# Patient Record
Sex: Female | Born: 1955 | Race: White | Hispanic: No | Marital: Married | Smoking: Never smoker
Health system: Southern US, Community
[De-identification: ages and names within clinical notes are randomized; demographics above are authoritative.]

## PROBLEM LIST (undated history)

## (undated) DIAGNOSIS — M810 Age-related osteoporosis without current pathological fracture: Secondary | ICD-10-CM

## (undated) DIAGNOSIS — L309 Dermatitis, unspecified: Secondary | ICD-10-CM

## (undated) HISTORY — PX: ELBOW SURGERY: SHX618

## (undated) HISTORY — DX: Age-related osteoporosis without current pathological fracture: M81.0

## (undated) HISTORY — DX: Dermatitis, unspecified: L30.9

## (undated) HISTORY — PX: KNEE SURGERY: SHX244

---

## 1998-09-20 ENCOUNTER — Other Ambulatory Visit: Admission: RE | Admit: 1998-09-20 | Discharge: 1998-09-20 | Payer: Self-pay | Admitting: Obstetrics & Gynecology

## 1999-09-23 ENCOUNTER — Other Ambulatory Visit: Admission: RE | Admit: 1999-09-23 | Discharge: 1999-09-23 | Payer: Self-pay | Admitting: Obstetrics & Gynecology

## 2000-09-29 ENCOUNTER — Other Ambulatory Visit: Admission: RE | Admit: 2000-09-29 | Discharge: 2000-09-29 | Payer: Self-pay | Admitting: Obstetrics & Gynecology

## 2001-01-18 ENCOUNTER — Other Ambulatory Visit: Admission: RE | Admit: 2001-01-18 | Discharge: 2001-01-18 | Payer: Self-pay | Admitting: Obstetrics & Gynecology

## 2001-10-25 ENCOUNTER — Other Ambulatory Visit: Admission: RE | Admit: 2001-10-25 | Discharge: 2001-10-25 | Payer: Self-pay | Admitting: Obstetrics & Gynecology

## 2002-04-13 ENCOUNTER — Other Ambulatory Visit: Admission: RE | Admit: 2002-04-13 | Discharge: 2002-04-13 | Payer: Self-pay | Admitting: Obstetrics & Gynecology

## 2002-10-04 ENCOUNTER — Encounter: Payer: Self-pay | Admitting: Unknown Physician Specialty

## 2002-10-04 ENCOUNTER — Encounter: Admission: RE | Admit: 2002-10-04 | Discharge: 2002-10-04 | Payer: Self-pay | Admitting: Unknown Physician Specialty

## 2002-12-12 ENCOUNTER — Other Ambulatory Visit: Admission: RE | Admit: 2002-12-12 | Discharge: 2002-12-12 | Payer: Self-pay | Admitting: Obstetrics & Gynecology

## 2003-12-14 ENCOUNTER — Other Ambulatory Visit: Admission: RE | Admit: 2003-12-14 | Discharge: 2003-12-14 | Payer: Self-pay | Admitting: Obstetrics & Gynecology

## 2005-01-03 ENCOUNTER — Other Ambulatory Visit: Admission: RE | Admit: 2005-01-03 | Discharge: 2005-01-03 | Payer: Self-pay | Admitting: Obstetrics & Gynecology

## 2005-06-23 ENCOUNTER — Other Ambulatory Visit: Admission: RE | Admit: 2005-06-23 | Discharge: 2005-06-23 | Payer: Self-pay | Admitting: Obstetrics & Gynecology

## 2006-02-02 ENCOUNTER — Other Ambulatory Visit: Admission: RE | Admit: 2006-02-02 | Discharge: 2006-02-02 | Payer: Self-pay | Admitting: Obstetrics & Gynecology

## 2010-04-24 ENCOUNTER — Encounter: Admission: RE | Admit: 2010-04-24 | Discharge: 2010-04-24 | Payer: Self-pay | Admitting: Obstetrics & Gynecology

## 2011-01-19 ENCOUNTER — Encounter: Payer: Self-pay | Admitting: Obstetrics & Gynecology

## 2017-09-30 ENCOUNTER — Other Ambulatory Visit: Payer: Self-pay | Admitting: Obstetrics & Gynecology

## 2017-09-30 DIAGNOSIS — E2839 Other primary ovarian failure: Secondary | ICD-10-CM

## 2017-10-02 ENCOUNTER — Other Ambulatory Visit: Payer: Self-pay | Admitting: Obstetrics & Gynecology

## 2017-10-02 DIAGNOSIS — E2839 Other primary ovarian failure: Secondary | ICD-10-CM

## 2021-01-22 ENCOUNTER — Other Ambulatory Visit: Payer: Self-pay | Admitting: Obstetrics & Gynecology

## 2021-01-22 DIAGNOSIS — R928 Other abnormal and inconclusive findings on diagnostic imaging of breast: Secondary | ICD-10-CM

## 2021-02-04 ENCOUNTER — Other Ambulatory Visit: Payer: Self-pay

## 2021-02-04 ENCOUNTER — Ambulatory Visit
Admission: RE | Admit: 2021-02-04 | Discharge: 2021-02-04 | Disposition: A | Discharge status: Home / Self Care | Payer: BC Managed Care – PPO | Source: Ambulatory Visit | Attending: Obstetrics & Gynecology | Admitting: Obstetrics & Gynecology

## 2021-02-04 DIAGNOSIS — R928 Other abnormal and inconclusive findings on diagnostic imaging of breast: Secondary | ICD-10-CM

## 2021-12-10 ENCOUNTER — Other Ambulatory Visit: Payer: Self-pay | Admitting: Obstetrics & Gynecology

## 2021-12-10 DIAGNOSIS — Z1231 Encounter for screening mammogram for malignant neoplasm of breast: Secondary | ICD-10-CM

## 2022-01-10 DIAGNOSIS — J309 Allergic rhinitis, unspecified: Secondary | ICD-10-CM | POA: Diagnosis not present

## 2022-01-23 DIAGNOSIS — M810 Age-related osteoporosis without current pathological fracture: Secondary | ICD-10-CM | POA: Diagnosis not present

## 2022-01-23 DIAGNOSIS — Z0000 Encounter for general adult medical examination without abnormal findings: Secondary | ICD-10-CM | POA: Diagnosis not present

## 2022-01-23 DIAGNOSIS — Z6820 Body mass index (BMI) 20.0-20.9, adult: Secondary | ICD-10-CM | POA: Diagnosis not present

## 2022-01-23 DIAGNOSIS — Z01419 Encounter for gynecological examination (general) (routine) without abnormal findings: Secondary | ICD-10-CM | POA: Diagnosis not present

## 2022-02-05 ENCOUNTER — Ambulatory Visit
Admission: RE | Admit: 2022-02-05 | Discharge: 2022-02-05 | Disposition: A | Discharge status: Home / Self Care | Payer: Medicare PPO | Source: Ambulatory Visit | Attending: Obstetrics & Gynecology | Admitting: Obstetrics & Gynecology

## 2022-02-05 DIAGNOSIS — Z1231 Encounter for screening mammogram for malignant neoplasm of breast: Secondary | ICD-10-CM

## 2022-04-08 DIAGNOSIS — J0100 Acute maxillary sinusitis, unspecified: Secondary | ICD-10-CM | POA: Diagnosis not present

## 2022-04-17 DIAGNOSIS — J0100 Acute maxillary sinusitis, unspecified: Secondary | ICD-10-CM | POA: Diagnosis not present

## 2022-05-05 DIAGNOSIS — J0100 Acute maxillary sinusitis, unspecified: Secondary | ICD-10-CM | POA: Diagnosis not present

## 2022-05-05 DIAGNOSIS — R0981 Nasal congestion: Secondary | ICD-10-CM | POA: Diagnosis not present

## 2022-05-27 ENCOUNTER — Ambulatory Visit: Payer: Medicare PPO | Admitting: Allergy

## 2022-05-27 ENCOUNTER — Encounter: Payer: Self-pay | Admitting: Allergy

## 2022-05-27 VITALS — BP 124/72 | HR 102 | Resp 16 | Ht 62.8 in | Wt 118.6 lb

## 2022-05-27 DIAGNOSIS — L2089 Other atopic dermatitis: Secondary | ICD-10-CM | POA: Diagnosis not present

## 2022-05-27 DIAGNOSIS — J3089 Other allergic rhinitis: Secondary | ICD-10-CM

## 2022-05-27 DIAGNOSIS — K9049 Malabsorption due to intolerance, not elsewhere classified: Secondary | ICD-10-CM

## 2022-05-27 DIAGNOSIS — H1013 Acute atopic conjunctivitis, bilateral: Secondary | ICD-10-CM

## 2022-05-27 DIAGNOSIS — J329 Chronic sinusitis, unspecified: Secondary | ICD-10-CM

## 2022-05-27 MED ORDER — XHANCE 93 MCG/ACT NA EXHU: INHALANT_SUSPENSION | NASAL | 5 refills | Status: DC

## 2022-05-27 MED ORDER — BUDESONIDE 0.5 MG/2ML IN SUSP: RESPIRATORY_TRACT | 5 refills | Status: DC

## 2022-05-27 NOTE — Progress Notes (Signed)
New Patient Note  RE: Stefanie Morales MRN: [id number] DOB: [birth year] Date of Office Visit: 05/27/2022  Primary care provider: Buckner Malta, MD  Chief Complaint: allergies  History of present illness: Stefanie Morales is a 67 y.o. female presenting today for evaluation of allergic rhinitis.   She reports a stuffy nose.  She currently can not breathe through her nose.  She can't get anything out of her nose.  She has tried using otc decongestants and mucinex. She takes flonase and zyrtec daily for years.  She does steam treatments in the shower.  She performs nasal saline rinses.  She also has tried some supplements including zinc picolinate, curcumin, quercetin, NAC. She has seen her PCP for current symptoms and has had a kenalog injection (usually helps with sinus infections but has not helped much this time).  She has had amoxicillin, augmentin and zpak all about 4 months ago for this.  She does feel like her symptoms are better now than it was she just can't breathe through nose currently.   She does sometimes get itchy eyes and sneezing.    She states she gets a lot of sinus infections since about early 2000s but states she has not had one for about 1.5 years until now.   Usually will get sinus pressure in forehead and ear and nasal congestion.  Usually does not get fever.  Treatment usually involve antibiotics +/- steroid injection.  May take several rounds of antibiotics to clear.   She did go to [facility] several weeks ago and reports having a sinus XR that was clear.  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   She has past history of eczema since childhood.  She does report being sensitive to insect bites and will develop a big welt at bite site. No history of asthma (however she was prescribed an albuterol inhaler as she was coughing a lot).    She avoids dairy since childhood.  She states she couldn't drink milk as a baby thus she was on soy  products.  She states now dairy constipates her so she avoids still.   Review of systems: Review of Systems  Constitutional: Negative.   HENT:  Positive for congestion.   Eyes: Negative.   Respiratory: Negative.    Cardiovascular: Negative.   Gastrointestinal: Negative.   Musculoskeletal: Negative.   Skin: Negative.   Allergic/Immunologic: Negative.   Neurological: Negative.    All other systems negative unless noted above in HPI  Past medical history: Past Medical History:  Diagnosis Date   Eczema    Osteoporosis     Past surgical history: Past Surgical History:  Procedure Laterality Date   ELBOW SURGERY     KNEE SURGERY      Family history:  Family History  Problem Relation Age of Onset   Breast cancer Sister    Allergic rhinitis Neg Hx    Angioedema Neg Hx    Asthma Neg Hx    Atopy Neg Hx    Immunodeficiency Neg Hx    Eczema Neg Hx    Urticaria Neg Hx     Social history: Lives in a home without carpeting with oil heating and central cooling.  Cats in the home.  Chickens outside the home.  There is no concern for liver damage, mildew or roaches in the home.  She is retired.  She does not report a smoking history.   Medication List: Current  Outpatient Medications  Medication Sig Dispense Refill   budesonide (PULMICORT) 0.5 MG/2ML nebulizer solution Perform budesonide plus saline irrigations 2 times daily 120 mL 5   Fluticasone Propionate (XHANCE) 93 MCG/ACT EXHU 1-2 sprays per nostril  twice daily 32 mL 5   raloxifene (EVISTA) 60 MG tablet Take by mouth.     No current facility-administered medications for this visit.    Known medication allergies: No Known Allergies   Physical examination: Blood pressure 124/72, pulse (!) 102, resp. rate 16, height 5' 2.8" (1.595 m), weight 118 lb 9.6 oz (53.8 kg), SpO2 99 %.  General: Alert, interactive, in no acute distress. HEENT: PERRLA, TMs pearly gray, turbinates moderately edematous with crusty discharge,  post-pharynx non erythematous. Neck: Supple without lymphadenopathy. Lungs: Clear to auscultation without wheezing, rhonchi or rales. {no increased work of breathing. CV: Normal S1, S2 without murmurs. Abdomen: Nondistended, nontender. Skin: Warm and dry, without lesions or rashes. Extremities:  No clubbing, cyanosis or edema. Neuro:   Grossly intact.  Diagnositics/Labs:  Allergy testing:   Airborne Adult Perc - 05/27/22 0858     Time Antigen Placed 1610    Allergen Manufacturer Waynette Buttery    Location Back    Number of Test 59    Panel 1 Select    1. Control-Buffer 50% Glycerol Negative    2. Control-Histamine 1 mg/ml 2+    3. Albumin saline Negative    4. Bahia Negative    5. French Southern Territories Negative    6. Johnson Negative    7. Kentucky Blue Negative    8. Meadow Fescue Negative    9. Perennial Rye Negative    10. Sweet Vernal Negative    11. Timothy Negative    12. Cocklebur Negative    13. Burweed Marshelder 2+    14. Ragweed, short 2+    15. Ragweed, Giant Negative    16. Plantain,  English Negative    17. Lamb's Quarters Negative    18. Sheep Sorrell Negative    19. Rough Pigweed Negative    20. Marsh Elder, Rough Negative    21. Mugwort, Common Negative    22. Ash mix Negative    23. Birch mix Negative    24. Beech American Negative    25. Box, Elder Negative    26. Cedar, red Negative    27. Cottonwood, Guinea-Bissau Negative    28. Elm mix Negative    29. Hickory Negative    30. Maple mix Negative    31. Oak, Guinea-Bissau mix Negative    32. Pecan Pollen 2+    33. Pine mix Negative    34. Sycamore Eastern Negative    35. Walnut, Black Pollen Negative    36. Alternaria alternata Negative    37. Cladosporium Herbarum Negative    38. Aspergillus mix Negative    39. Penicillium mix Negative    40. Bipolaris sorokiniana (Helminthosporium) Negative    41. Drechslera spicifera (Curvularia) Negative    42. Mucor plumbeus Negative    43. Fusarium moniliforme Negative    44.  Aureobasidium pullulans (pullulara) Negative    45. Rhizopus oryzae Negative    46. Botrytis cinera Negative    47. Epicoccum nigrum Negative    48. Phoma betae Negative    49. Candida Albicans Negative    50. Trichophyton mentagrophytes Negative    51. Mite, D Farinae  5,000 AU/ml Negative    52. Mite, D Pteronyssinus  5,000 AU/ml Negative    53. Cat Hair 10,000  BAU/ml 2+    54.  Dog Epithelia 2+    55. Mixed Feathers Negative    56. Horse Epithelia Negative    57. Cockroach, German Negative    58. Mouse Negative    59. Tobacco Leaf Negative             Intradermal - 05/27/22 0935     Time Antigen Placed 0935    Allergen Manufacturer Waynette Buttery    Location Arm    Number of Test 11    Intradermal Select    Control Negative    French Southern Territories Negative    Johnson Negative    7 Grass Negative    Tree mix Negative    Mold 1 Negative    Mold 2 Negative    Mold 3 Negative    Mold 4 2+    Cockroach Negative    Mite mix Negative             Food Adult Perc - 05/27/22 0800     Time Antigen Placed 9798    Allergen Manufacturer Waynette Buttery    Location Back    Number of allergen test 1    5. Milk, cow Negative             Allergy testing results were read and interpreted by provider, documented by clinical staff.   Assessment and plan: Chronic rhinosinusitis Allergic rhinitis with conjunctivitis - Testing today showed: weeds, trees, outdoor molds, cat, and dog. - Copy of test results provided.  - Avoidance measures provided. - Stop taking: Zyrtec and Flonase for now - Start taking:  Xyzal (levocetirizine) 5mg  tablet once daily.  This will replace Zyrtec for now. Will see if we can get Xhance covered.  Xhance (fluticasone) 1-2 sprays per nostril twice daily.  is fluticasone (same steroid of Flonase but at a higher dosage) with a special device that allows for deeper deposition of the medication into your sinus tract for better congestion control.  Provided with a sample  for you to try at this time.  Let Timmothy Sours know if it is more effective than Flonase.  [place] can be used 1-2 sprays each nostril 1-2 times per day.  If Timmothy Sours is not covered then will have you perform steroid sinus rinses. Budesonide (Pulmicort) + Saline Irrigation/Rinse  Budesonide (Pulmicort) is an anti-inflammatory steroid medication used to decrease nasal and sinus inflammation. It is dispensed in liquid form in a vial. Although it is manufactured for use with a nebulizer, we intend for you to use it with the NeilMed Sinus Rinse bottle (preferred) or a Neti pot.   Instructions:  1) Make 240cc of saline in the NeilMed bottle using the salt packets or your own saline recipe (see separate handout).  2) Add the entire 2cc vial of liquid Budesonide (Pulmicort) to the rinse bottle and mix together.  3) While in the shower or over the sink, tilt your head forward to a comfortable level. Put the tip of the sinus rinse bottle in your nostril and aim it towards the crown or top of your head. Gently squeeze the bottle to flush out your nose. The fluid will circulate in and out of your sinus cavities, coming back out from either nostril or through your mouth. Try not to swallow large quantities and spit it out instead.  4) Perform Budesonide (Pulmicort) + Saline irrigations 2 times daily.  Pataday (olopatadine) one drop per eye daily as needed for itchy/watery eyes  - You can use an extra  dose of the antihistamine, if needed, for breakthrough symptoms.  - Continue nasal saline rinses 1-2 times daily to remove allergens from the nasal cavities as well as help with mucous clearance (this is especially helpful to do before the nasal sprays are given) - Consider allergy shots as a means of long-term control. - Allergy shots "re-train" and "reset" the immune system to ignore environmental allergens and decrease the resulting immune response to those allergens (sneezing, itchy watery eyes, runny nose, nasal  congestion, etc).    - Allergy shots improve symptoms in 75-85% of patients.  - We can discuss more at future appointment if the medications are not working for you.  Food intolerance - Milk allergy testing is negative.  Most likely represents intolerance.  Continue avoidance to prevent constipation  Eczema - Perform daily moisturization for eczema control - For but bites reactivity can use over-the-counter Hydrocortisone ointment and take additional antihistamine if needed  Follow-up in 4-6 months or sooner if needed  I appreciate the opportunity to take part in Sumayyah's care. Please do not hesitate to contact me with questions.  Sincerely,   Margo AyeShaylar Oshua Mcconaha, MD Allergy/Immunology [facility]

## 2022-05-27 NOTE — Patient Instructions (Signed)
Allergies, Nasal congestion - Testing today showed: weeds, trees, outdoor molds, cat, and dog. - Copy of test results provided.  - Avoidance measures provided. - Stop taking: Zyrtec and Flonase for now - Start taking:  Xyzal (levocetirizine) 5mg  tablet once daily.  This will replace Zyrtec for now. Will see if we can get Xhance covered.  Xhance (fluticasone) 1-2 sprays per nostril twice daily.  is fluticasone (same steroid of Flonase but at a higher dosage) with a special device that allows for deeper deposition of the medication into your sinus tract for better congestion control.  Provided with a sample for you to try at this time.  Let Timmothy Sours know if it is more effective than Flonase.  [place] can be used 1-2 sprays each nostril 1-2 times per day.  If Timmothy Sours is not covered then will have you perform steroid sinus rinses. Budesonide (Pulmicort) + Saline Irrigation/Rinse  Budesonide (Pulmicort) is an anti-inflammatory steroid medication used to decrease nasal and sinus inflammation. It is dispensed in liquid form in a vial. Although it is manufactured for use with a nebulizer, we intend for you to use it with the NeilMed Sinus Rinse bottle (preferred) or a Neti pot.   Instructions:  1) Make 240cc of saline in the NeilMed bottle using the salt packets or your own saline recipe (see separate handout).  2) Add the entire 2cc vial of liquid Budesonide (Pulmicort) to the rinse bottle and mix together.  3) While in the shower or over the sink, tilt your head forward to a comfortable level. Put the tip of the sinus rinse bottle in your nostril and aim it towards the crown or top of your head. Gently squeeze the bottle to flush out your nose. The fluid will circulate in and out of your sinus cavities, coming back out from either nostril or through your mouth. Try not to swallow large quantities and spit it out instead.  4) Perform Budesonide (Pulmicort) + Saline irrigations 2 times daily.  Pataday  (olopatadine) one drop per eye daily as needed for itchy/watery eyes  - You can use an extra dose of the antihistamine, if needed, for breakthrough symptoms.  - Continue nasal saline rinses 1-2 times daily to remove allergens from the nasal cavities as well as help with mucous clearance (this is especially helpful to do before the nasal sprays are given) - Consider allergy shots as a means of long-term control. - Allergy shots "re-train" and "reset" the immune system to ignore environmental allergens and decrease the resulting immune response to those allergens (sneezing, itchy watery eyes, runny nose, nasal congestion, etc).    - Allergy shots improve symptoms in 75-85% of patients.  - We can discuss more at future appointment if the medications are not working for you.  Food intolerance - Milk allergy testing is negative.  Most likely represents intolerance.  Continue avoidance to prevent constipation  Eczema - Perform daily moisturization for eczema control - For but bites reactivity can use over-the-counter Hydrocortisone ointment and take additional antihistamine if needed  Follow-up in 4-6 months or sooner if needed

## 2022-05-28 DIAGNOSIS — L820 Inflamed seborrheic keratosis: Secondary | ICD-10-CM | POA: Diagnosis not present

## 2022-05-30 ENCOUNTER — Telehealth: Payer: Self-pay

## 2022-05-30 NOTE — Telephone Encounter (Signed)
PA has been faxed to [facility] via [employer] med[facility]   Key: [id number] Last name: Canny  DOB: [birth year]

## 2022-07-01 DIAGNOSIS — S6292XA Unspecified fracture of left wrist and hand, initial encounter for closed fracture: Secondary | ICD-10-CM | POA: Diagnosis not present

## 2022-07-01 DIAGNOSIS — S62345A Nondisplaced fracture of base of fourth metacarpal bone, left hand, initial encounter for closed fracture: Secondary | ICD-10-CM | POA: Diagnosis not present

## 2022-07-01 DIAGNOSIS — M25532 Pain in left wrist: Secondary | ICD-10-CM | POA: Diagnosis not present

## 2022-07-01 DIAGNOSIS — S32592A Other specified fracture of left pubis, initial encounter for closed fracture: Secondary | ICD-10-CM | POA: Diagnosis not present

## 2022-07-01 DIAGNOSIS — S32512A Fracture of superior rim of left pubis, initial encounter for closed fracture: Secondary | ICD-10-CM | POA: Diagnosis not present

## 2022-07-14 DIAGNOSIS — S32512A Fracture of superior rim of left pubis, initial encounter for closed fracture: Secondary | ICD-10-CM | POA: Diagnosis not present

## 2022-08-25 DIAGNOSIS — S32512A Fracture of superior rim of left pubis, initial encounter for closed fracture: Secondary | ICD-10-CM | POA: Diagnosis not present

## 2022-09-03 DIAGNOSIS — H52223 Regular astigmatism, bilateral: Secondary | ICD-10-CM | POA: Diagnosis not present

## 2022-09-03 DIAGNOSIS — H5203 Hypermetropia, bilateral: Secondary | ICD-10-CM | POA: Diagnosis not present

## 2022-10-15 DIAGNOSIS — Z23 Encounter for immunization: Secondary | ICD-10-CM | POA: Diagnosis not present

## 2022-10-21 ENCOUNTER — Encounter: Payer: Self-pay | Admitting: Allergy

## 2022-10-21 ENCOUNTER — Ambulatory Visit: Payer: Medicare PPO | Admitting: Allergy

## 2022-10-21 VITALS — BP 142/70 | HR 88 | Resp 18

## 2022-10-21 DIAGNOSIS — K9049 Malabsorption due to intolerance, not elsewhere classified: Secondary | ICD-10-CM

## 2022-10-21 DIAGNOSIS — J3089 Other allergic rhinitis: Secondary | ICD-10-CM

## 2022-10-21 DIAGNOSIS — H1013 Acute atopic conjunctivitis, bilateral: Secondary | ICD-10-CM

## 2022-10-21 DIAGNOSIS — L2089 Other atopic dermatitis: Secondary | ICD-10-CM | POA: Diagnosis not present

## 2022-10-21 MED ORDER — BUDESONIDE 0.5 MG/2ML IN SUSP: RESPIRATORY_TRACT | 5 refills | Status: AC

## 2022-10-21 NOTE — Progress Notes (Signed)
Follow-up Note  RE: Stefanie Morales MRN: [id number] DOB: Apr 29, 1956 Date of Office Visit: 10/21/2022   History of present illness: Stefanie Morales is a 66 y.o. female presenting today for follow-up of allergic rhinitis, food intolerance, eczema.  She was seen for initial visit on 05/27/22 by myself.    She states she is doing better.  She has not had to go to the doctor since last visit and has not required any antibiotics.  She states she has had occasional puffiness around the eyes and sinus headache but has not had any sinus pressure in the cheeks.  If she has sinus headache then will use advil cold and sinus or something similar. She states the budesonide nasal saline rinses are working for her.  Currently performing this twice a day. She is not sure if xyzal is doing any more than the zyrtec was.  She states she has not needed to use any eye drops.  She denies any bug bites this summer.  She does avoid dairy products but will use 'fake ice cream' or non-lactose containing products if wanting something dairy in diet.    Review of systems: Review of Systems  Constitutional: Negative.   HENT: Negative.    Eyes: Negative.   Respiratory: Negative.    Cardiovascular: Negative.   Gastrointestinal: Negative.   Musculoskeletal: Negative.   Skin: Negative.   Allergic/Immunologic: Negative.   Neurological: Negative.      All other systems negative unless noted above in HPI  Past medical/social/surgical/family history have been reviewed and are unchanged unless specifically indicated below.  No changes  Medication List: Current Outpatient Medications  Medication Sig Dispense Refill   budesonide (PULMICORT) 0.5 MG/2ML nebulizer solution Perform budesonide plus saline irrigations 2 times daily 120 mL 5   levocetirizine (XYZAL) 5 MG tablet Take 5 mg by mouth every evening.     raloxifene (EVISTA) 60 MG tablet Take by mouth.     No current facility-administered medications for this visit.      Known medication allergies: No Known Allergies   Physical examination: Blood pressure (!) 142/70, pulse 88, resp. rate 18, SpO2 99 %.  General: Alert, interactive, in no acute distress. HEENT: PERRLA, TMs pearly gray, turbinates non-edematous without discharge, post-pharynx non erythematous. Neck: Supple without lymphadenopathy. Lungs: Clear to auscultation without wheezing, rhonchi or rales. {no increased work of breathing. CV: Normal S1, S2 without murmurs. Abdomen: Nondistended, nontender. Skin: Warm and dry, without lesions or rashes. Extremities:  No clubbing, cyanosis or edema. Neuro:   Grossly intact.  Diagnositics/Labs: None today  Assessment and plan: Allergic rhinitis with conjunctivitis  - Continue avoidance measures for weeds, trees, outdoor molds, cat, and dog. - Consider allergy shots as a means of long-term control if medication management becomes ineffective - Allergy shots "re-train" and "reset" the immune system to ignore environmental allergens and decrease the resulting immune response to those allergens (sneezing, itchy watery eyes, runny nose, nasal congestion, etc).    - Allergy shots improve symptoms in 75-85% of patients.   - Continue the following: Xyzal (levocetirizine) 5mg  tablet once daily. You can use an extra dose of the antihistamine, if needed, for breakthrough symptoms. Pataday (olopatadine) one drop per eye daily as needed for itchy/watery eyes Budesonide nasal rinses 1-2 times a day.  Discussed trying once a day use and see if this keeps sinus symptoms controlled, if not then go back to twice a day use.   Food intolerance - Milk allergy testing was negative.  Most likely represents intolerance.  Continue avoidance to prevent constipation.  Can try use of lactose-free dairy products like Lactaid milk, Fairlife etc.   If wanting to eat solid dairy products (like cheese, ice cream, yogurts etc) you can take lactase enzyme pill prior to known  dairy ingestion to help prevent gut tract symptoms.    Eczema - Perform daily moisturization for eczema control - For bug bite reactivity can use over-the-counter Hydrocortisone ointment and take additional antihistamine if needed  Follow-up in 6 months or sooner if needed  I appreciate the opportunity to take part in Stefanie Morales's care. Please do not hesitate to contact me with questions.  Sincerely,   Prudy Feeler, MD Allergy/Immunology [facility]

## 2022-10-21 NOTE — Patient Instructions (Addendum)
Allergies, Nasal congestion - Continue avoidance measures for weeds, trees, outdoor molds, cat, and dog. - Consider allergy shots as a means of long-term control if medication management becomes ineffective - Allergy shots "re-train" and "reset" the immune system to ignore environmental allergens and decrease the resulting immune response to those allergens (sneezing, itchy watery eyes, runny nose, nasal congestion, etc).    - Allergy shots improve symptoms in 75-85% of patients.   - Continue the following: Xyzal (levocetirizine) 5mg  tablet once daily. You can use an extra dose of the antihistamine, if needed, for breakthrough symptoms. Pataday (olopatadine) one drop per eye daily as needed for itchy/watery eyes Budesonide nasal rinses 1-2 times a day.  Discussed trying once a day use and see if this keeps sinus symptoms controlled, if not then go back to twice a day use.   Food intolerance - Milk allergy testing was negative.  Most likely represents intolerance.  Continue avoidance to prevent constipation.  Can try use of lactose-free dairy products like Lactaid milk, Fairlife etc.   If wanting to eat solid dairy products (like cheese, ice cream, yogurts etc) you can take lactase enzyme pill prior to known dairy ingestion to help prevent gut tract symptoms.    Eczema - Perform daily moisturization for eczema control - For bug bite reactivity can use over-the-counter Hydrocortisone ointment and take additional antihistamine if needed  Follow-up in 6 months or sooner if needed

## 2022-11-27 ENCOUNTER — Other Ambulatory Visit: Payer: Self-pay | Admitting: Obstetrics & Gynecology

## 2022-11-27 DIAGNOSIS — Z1231 Encounter for screening mammogram for malignant neoplasm of breast: Secondary | ICD-10-CM

## 2022-12-16 DIAGNOSIS — Z6820 Body mass index (BMI) 20.0-20.9, adult: Secondary | ICD-10-CM | POA: Diagnosis not present

## 2022-12-16 DIAGNOSIS — Z13220 Encounter for screening for lipoid disorders: Secondary | ICD-10-CM | POA: Diagnosis not present

## 2022-12-16 DIAGNOSIS — Z1331 Encounter for screening for depression: Secondary | ICD-10-CM | POA: Diagnosis not present

## 2022-12-16 DIAGNOSIS — M810 Age-related osteoporosis without current pathological fracture: Secondary | ICD-10-CM | POA: Diagnosis not present

## 2022-12-16 DIAGNOSIS — R7302 Impaired glucose tolerance (oral): Secondary | ICD-10-CM | POA: Diagnosis not present

## 2022-12-16 DIAGNOSIS — Z0000 Encounter for general adult medical examination without abnormal findings: Secondary | ICD-10-CM | POA: Diagnosis not present

## 2022-12-16 DIAGNOSIS — J302 Other seasonal allergic rhinitis: Secondary | ICD-10-CM | POA: Diagnosis not present

## 2022-12-16 DIAGNOSIS — Z79899 Other long term (current) drug therapy: Secondary | ICD-10-CM | POA: Diagnosis not present

## 2023-01-31 DIAGNOSIS — B079 Viral wart, unspecified: Secondary | ICD-10-CM | POA: Diagnosis not present

## 2023-01-31 DIAGNOSIS — L0201 Cutaneous abscess of face: Secondary | ICD-10-CM | POA: Diagnosis not present

## 2023-02-09 ENCOUNTER — Ambulatory Visit
Admission: RE | Admit: 2023-02-09 | Discharge: 2023-02-09 | Disposition: A | Discharge status: Home / Self Care | Payer: Medicare PPO | Source: Ambulatory Visit | Attending: Obstetrics & Gynecology | Admitting: Obstetrics & Gynecology

## 2023-02-09 DIAGNOSIS — Z1231 Encounter for screening mammogram for malignant neoplasm of breast: Secondary | ICD-10-CM | POA: Diagnosis not present

## 2023-02-11 DIAGNOSIS — Z01419 Encounter for gynecological examination (general) (routine) without abnormal findings: Secondary | ICD-10-CM | POA: Diagnosis not present

## 2023-02-11 DIAGNOSIS — Z6820 Body mass index (BMI) 20.0-20.9, adult: Secondary | ICD-10-CM | POA: Diagnosis not present

## 2023-02-23 DIAGNOSIS — Z1211 Encounter for screening for malignant neoplasm of colon: Secondary | ICD-10-CM | POA: Diagnosis not present

## 2023-04-21 DIAGNOSIS — R195 Other fecal abnormalities: Secondary | ICD-10-CM | POA: Diagnosis not present

## 2023-04-21 DIAGNOSIS — K5900 Constipation, unspecified: Secondary | ICD-10-CM | POA: Diagnosis not present

## 2023-05-14 DIAGNOSIS — R195 Other fecal abnormalities: Secondary | ICD-10-CM | POA: Diagnosis not present

## 2023-05-14 DIAGNOSIS — Z1211 Encounter for screening for malignant neoplasm of colon: Secondary | ICD-10-CM | POA: Diagnosis not present

## 2023-05-23 IMAGING — MG MM DIGITAL SCREENING BILAT W/ TOMO AND CAD
8 series · 9 of 24 positions shown · non-contrast
Comparison: Previous exam(s).

CLINICAL DATA: Screening.

EXAM:
DIGITAL SCREENING BILATERAL MAMMOGRAM WITH TOMOSYNTHESIS AND CAD
TECHNIQUE: Bilateral screening digital craniocaudal and mediolateral oblique
mammograms were obtained. Bilateral screening digital breast
tomosynthesis was performed. The images were evaluated with
computer-aided detection.

[R CC synth-2D]
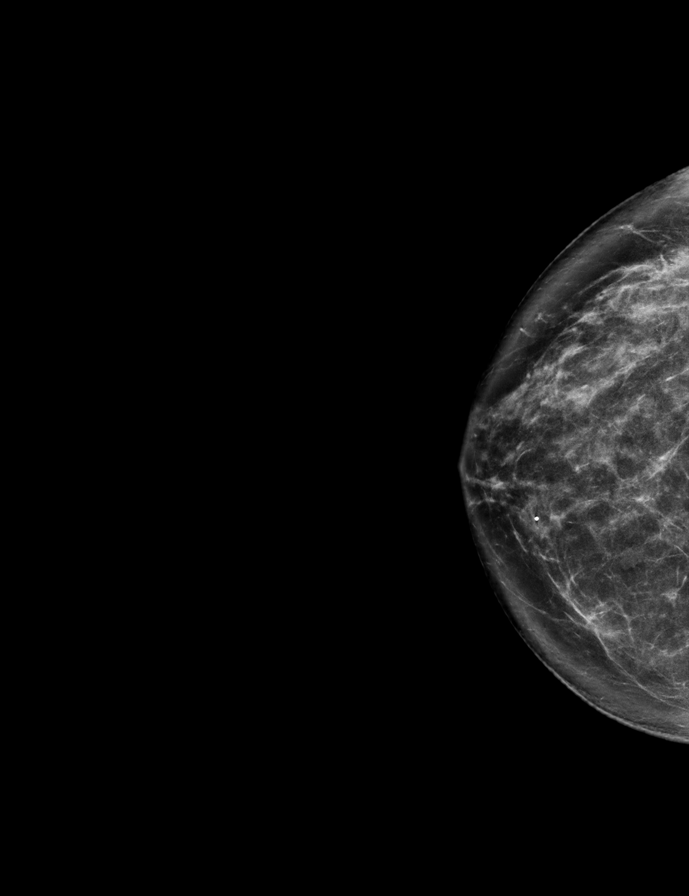

[R MLO synth-2D]
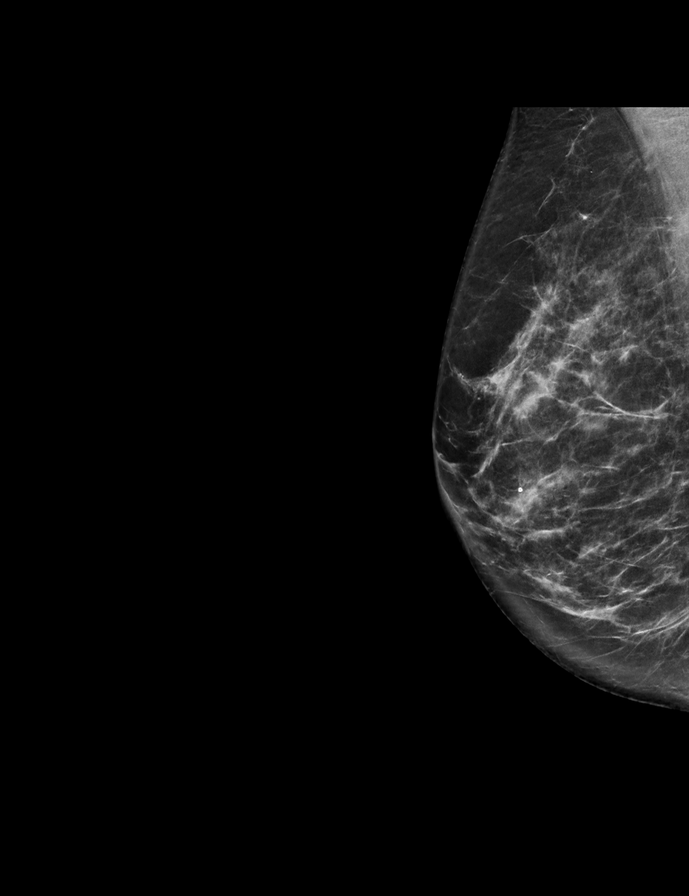

[L MLO synth-2D]
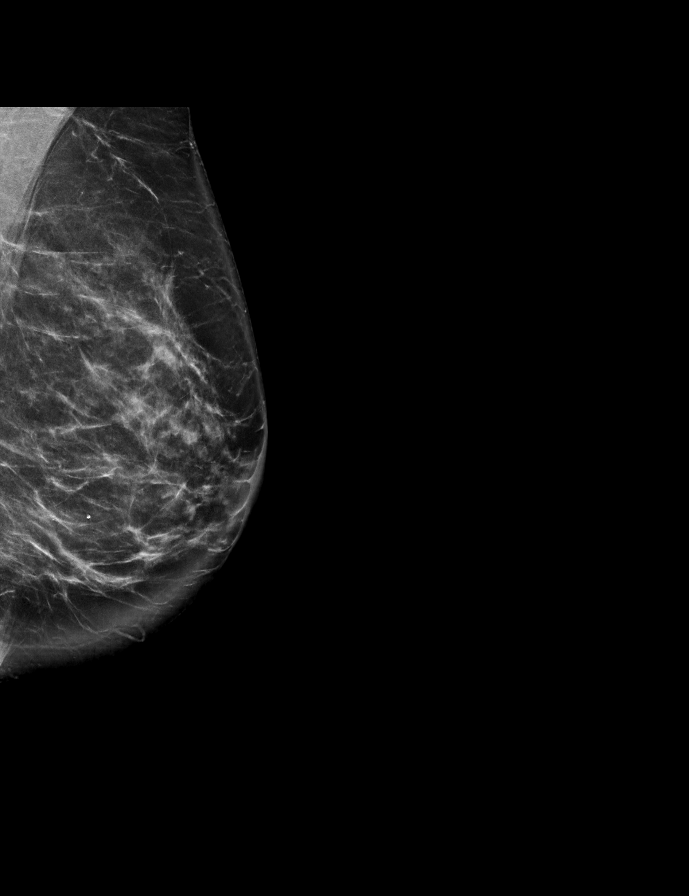

[L CC synth-2D]
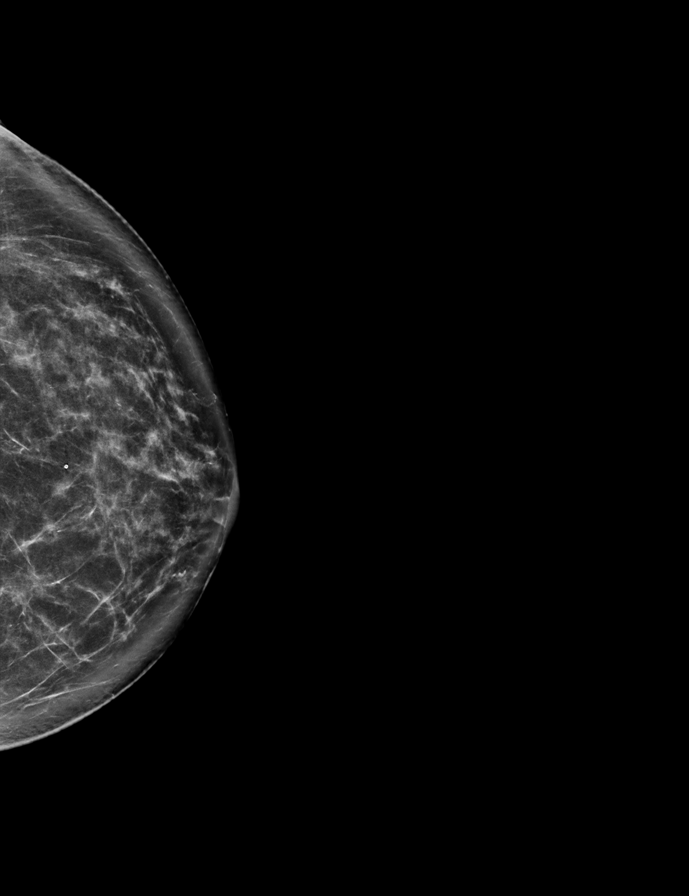

[L MLO tomo · 2 of 78 frames shown]
[frame 26/78]
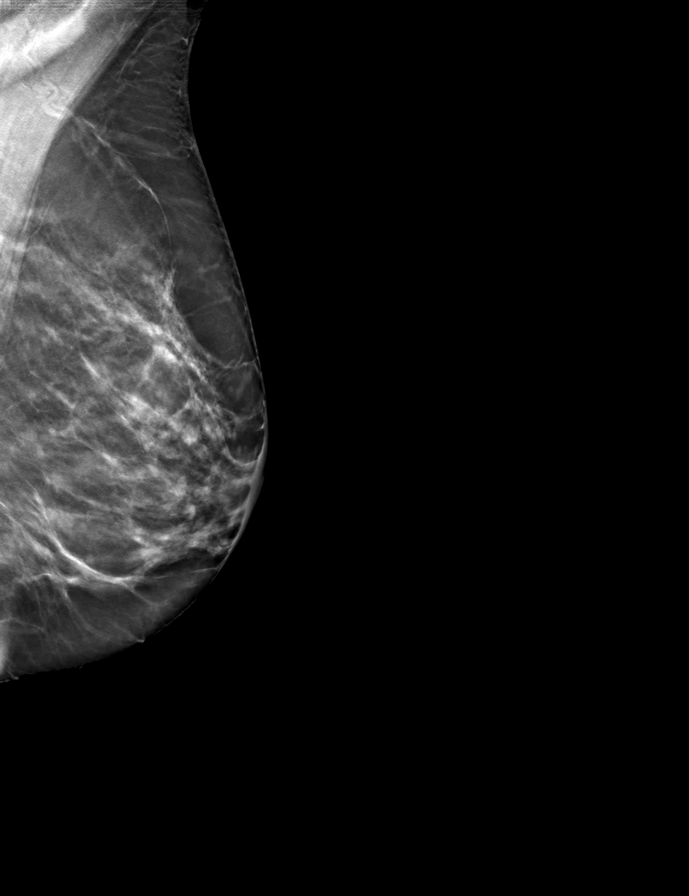
[frame 39/78]
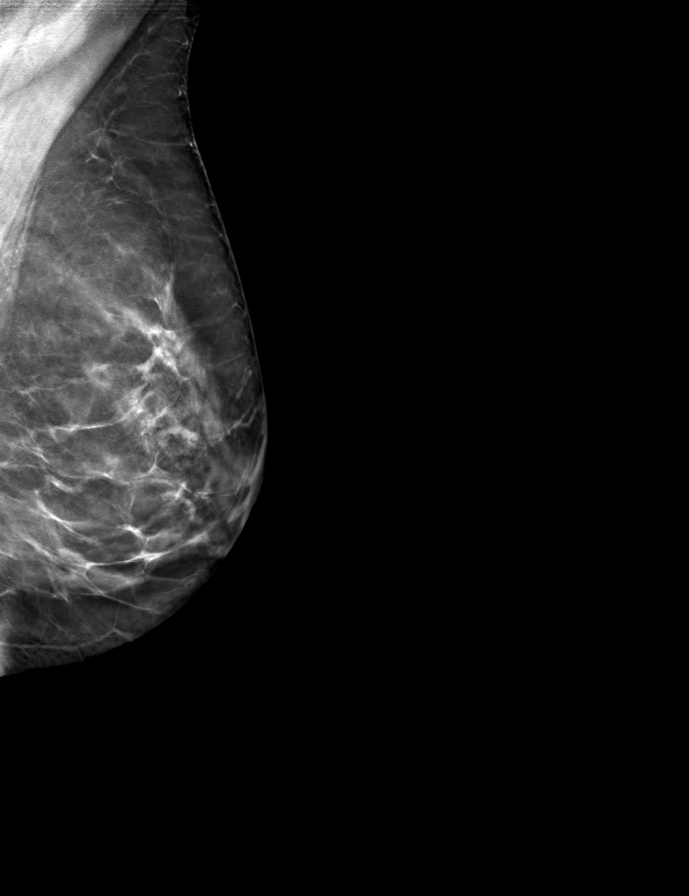

[L CC tomo · tomo slice 40/79.0]
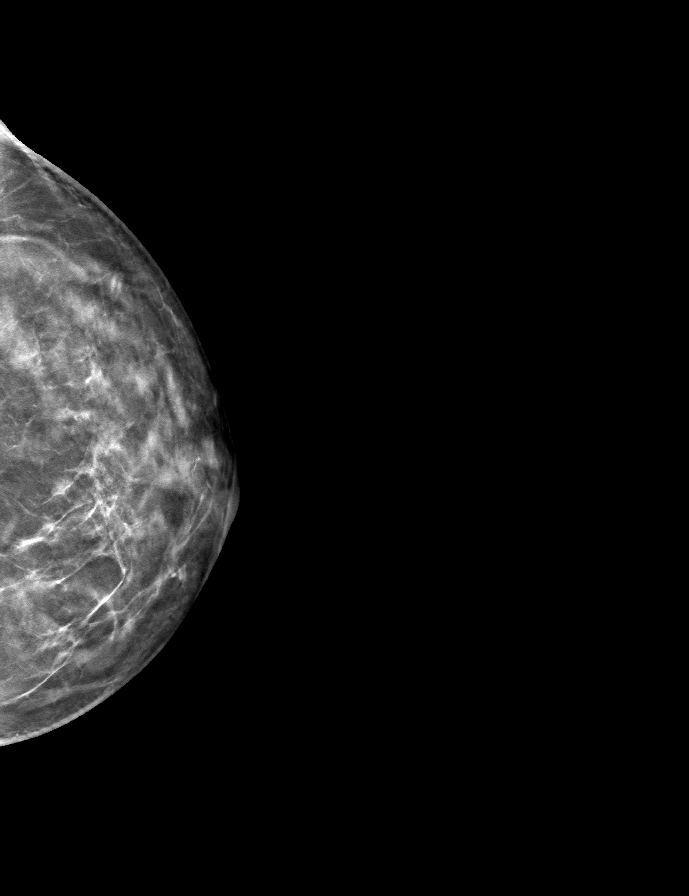

[R MLO tomo · tomo slice 37/72.0]
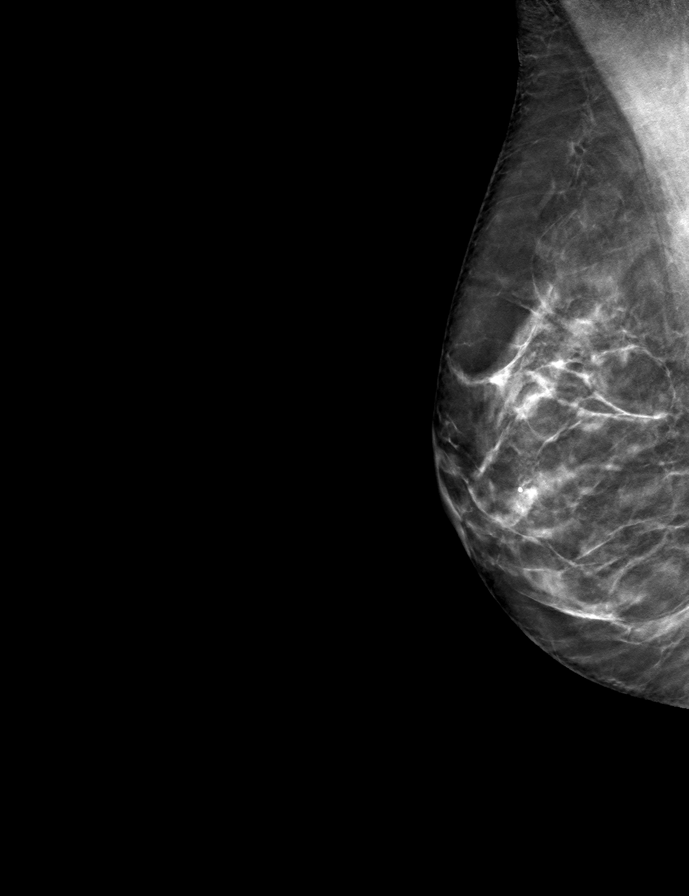

[R CC tomo · tomo slice 39/76.0]
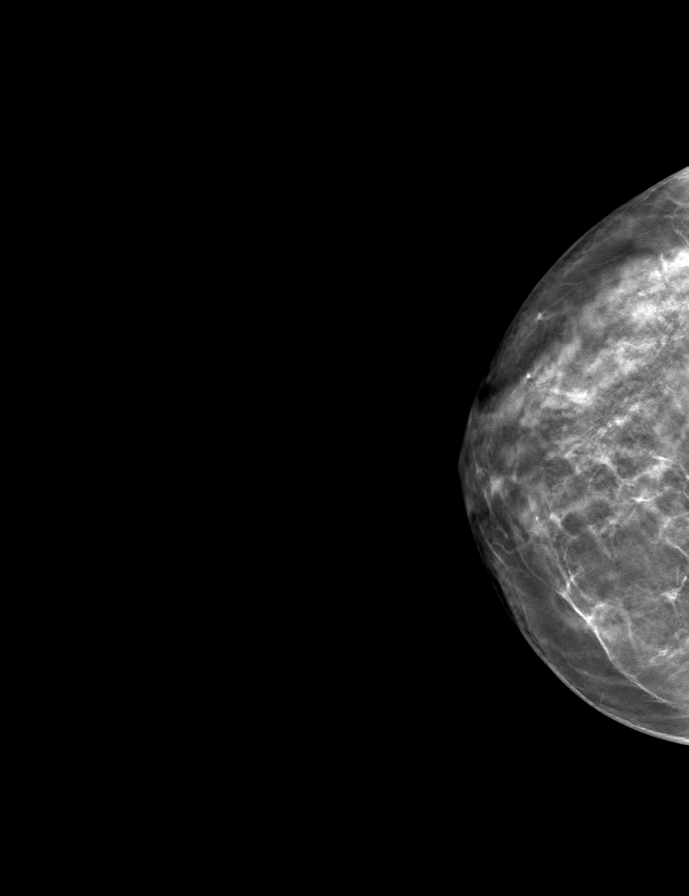

[9 of 24 positions shown; findings below may reference images not displayed]

ACR Breast Density Category c: The breast tissue is heterogeneously
dense, which may obscure small masses.
FINDINGS: There are no findings suspicious for malignancy.
IMPRESSION: No mammographic evidence of malignancy. A result letter of this
screening mammogram will be mailed directly to the patient.

RECOMMENDATION:
Screening mammogram in one year. (Code:Q3-W-BC3)

BI-RADS CATEGORY  1: Negative.

## 2023-06-18 DIAGNOSIS — Z681 Body mass index (BMI) 19 or less, adult: Secondary | ICD-10-CM | POA: Diagnosis not present

## 2023-06-18 DIAGNOSIS — L237 Allergic contact dermatitis due to plants, except food: Secondary | ICD-10-CM | POA: Diagnosis not present

## 2023-09-03 ENCOUNTER — Other Ambulatory Visit: Payer: Self-pay | Admitting: Allergy

## 2023-09-11 DIAGNOSIS — L820 Inflamed seborrheic keratosis: Secondary | ICD-10-CM | POA: Diagnosis not present

## 2023-09-11 DIAGNOSIS — L0100 Impetigo, unspecified: Secondary | ICD-10-CM | POA: Diagnosis not present

## 2023-10-07 DIAGNOSIS — D485 Neoplasm of uncertain behavior of skin: Secondary | ICD-10-CM | POA: Diagnosis not present

## 2023-10-29 ENCOUNTER — Other Ambulatory Visit: Payer: Self-pay | Admitting: Allergy

## 2023-10-31 ENCOUNTER — Other Ambulatory Visit: Payer: Self-pay | Admitting: Allergy

## 2024-01-08 DIAGNOSIS — Z79899 Other long term (current) drug therapy: Secondary | ICD-10-CM | POA: Diagnosis not present

## 2024-01-08 DIAGNOSIS — E7439 Other disorders of intestinal carbohydrate absorption: Secondary | ICD-10-CM | POA: Diagnosis not present

## 2024-01-08 DIAGNOSIS — M810 Age-related osteoporosis without current pathological fracture: Secondary | ICD-10-CM | POA: Diagnosis not present

## 2024-01-08 DIAGNOSIS — Z0000 Encounter for general adult medical examination without abnormal findings: Secondary | ICD-10-CM | POA: Diagnosis not present

## 2024-01-08 DIAGNOSIS — Z1331 Encounter for screening for depression: Secondary | ICD-10-CM | POA: Diagnosis not present

## 2024-01-08 DIAGNOSIS — Z136 Encounter for screening for cardiovascular disorders: Secondary | ICD-10-CM | POA: Diagnosis not present

## 2024-02-12 DIAGNOSIS — Z1231 Encounter for screening mammogram for malignant neoplasm of breast: Secondary | ICD-10-CM | POA: Diagnosis not present

## 2024-02-16 DIAGNOSIS — Z1231 Encounter for screening mammogram for malignant neoplasm of breast: Secondary | ICD-10-CM | POA: Diagnosis not present

## 2024-02-19 DIAGNOSIS — Z780 Asymptomatic menopausal state: Secondary | ICD-10-CM | POA: Diagnosis not present

## 2024-02-19 DIAGNOSIS — M810 Age-related osteoporosis without current pathological fracture: Secondary | ICD-10-CM | POA: Diagnosis not present

## 2024-02-19 DIAGNOSIS — M8588 Other specified disorders of bone density and structure, other site: Secondary | ICD-10-CM | POA: Diagnosis not present

## 2024-02-29 DIAGNOSIS — Z01419 Encounter for gynecological examination (general) (routine) without abnormal findings: Secondary | ICD-10-CM | POA: Diagnosis not present

## 2024-02-29 DIAGNOSIS — Z124 Encounter for screening for malignant neoplasm of cervix: Secondary | ICD-10-CM | POA: Diagnosis not present

## 2024-04-22 DIAGNOSIS — D225 Melanocytic nevi of trunk: Secondary | ICD-10-CM | POA: Diagnosis not present

## 2024-04-22 DIAGNOSIS — L820 Inflamed seborrheic keratosis: Secondary | ICD-10-CM | POA: Diagnosis not present

## 2024-04-22 DIAGNOSIS — L821 Other seborrheic keratosis: Secondary | ICD-10-CM | POA: Diagnosis not present

## 2024-04-22 DIAGNOSIS — L578 Other skin changes due to chronic exposure to nonionizing radiation: Secondary | ICD-10-CM | POA: Diagnosis not present

## 2024-04-22 DIAGNOSIS — D485 Neoplasm of uncertain behavior of skin: Secondary | ICD-10-CM | POA: Diagnosis not present

## 2024-04-22 DIAGNOSIS — L814 Other melanin hyperpigmentation: Secondary | ICD-10-CM | POA: Diagnosis not present

## 2024-04-29 DIAGNOSIS — R0981 Nasal congestion: Secondary | ICD-10-CM | POA: Diagnosis not present

## 2024-04-29 DIAGNOSIS — J0100 Acute maxillary sinusitis, unspecified: Secondary | ICD-10-CM | POA: Diagnosis not present

## 2024-04-29 DIAGNOSIS — R051 Acute cough: Secondary | ICD-10-CM | POA: Diagnosis not present

## 2024-05-10 DIAGNOSIS — R051 Acute cough: Secondary | ICD-10-CM | POA: Diagnosis not present

## 2024-05-10 DIAGNOSIS — R0981 Nasal congestion: Secondary | ICD-10-CM | POA: Diagnosis not present

## 2024-05-10 DIAGNOSIS — H5713 Ocular pain, bilateral: Secondary | ICD-10-CM | POA: Diagnosis not present

## 2024-08-11 DIAGNOSIS — I781 Nevus, non-neoplastic: Secondary | ICD-10-CM | POA: Diagnosis not present

## 2024-08-11 DIAGNOSIS — D485 Neoplasm of uncertain behavior of skin: Secondary | ICD-10-CM | POA: Diagnosis not present

## 2024-08-11 DIAGNOSIS — L820 Inflamed seborrheic keratosis: Secondary | ICD-10-CM | POA: Diagnosis not present

## 2024-10-11 DIAGNOSIS — H5203 Hypermetropia, bilateral: Secondary | ICD-10-CM | POA: Diagnosis not present

## 2024-10-11 DIAGNOSIS — H25813 Combined forms of age-related cataract, bilateral: Secondary | ICD-10-CM | POA: Diagnosis not present
